# Patient Record
Sex: Female | Born: 1995 | Race: White | Hispanic: No | Marital: Single | Smoking: Never smoker
Health system: Southern US, Community
[De-identification: ages and names within clinical notes are randomized; demographics above are authoritative.]

## PROBLEM LIST (undated history)

## (undated) HISTORY — PX: BACK SURGERY: SHX140

---

## 2016-04-11 ENCOUNTER — Emergency Department (HOSPITAL_COMMUNITY): Payer: BLUE CROSS/BLUE SHIELD

## 2016-04-11 ENCOUNTER — Emergency Department (HOSPITAL_COMMUNITY)
Admission: EM | Admit: 2016-04-11 | Discharge: 2016-04-11 | Disposition: A | Discharge status: Home / Self Care | Payer: BLUE CROSS/BLUE SHIELD | Attending: Emergency Medicine | Admitting: Emergency Medicine

## 2016-04-11 ENCOUNTER — Encounter (HOSPITAL_COMMUNITY): Payer: Self-pay

## 2016-04-11 DIAGNOSIS — M545 Low back pain: Secondary | ICD-10-CM | POA: Diagnosis present

## 2016-04-11 DIAGNOSIS — Z791 Long term (current) use of non-steroidal anti-inflammatories (NSAID): Secondary | ICD-10-CM | POA: Diagnosis not present

## 2016-04-11 DIAGNOSIS — M546 Pain in thoracic spine: Secondary | ICD-10-CM | POA: Diagnosis not present

## 2016-04-11 MED ORDER — IBUPROFEN 800 MG PO TABS
800.0000 mg | ORAL_TABLET | Freq: Once | ORAL | Status: DC
  Filled 2016-04-11: qty 1

## 2016-04-11 MED ORDER — ACETAMINOPHEN 500 MG PO TABS
1000.0000 mg | ORAL_TABLET | Freq: Once | ORAL | Status: DC
  Filled 2016-04-11: qty 2

## 2016-04-11 MED ORDER — DIAZEPAM 5 MG PO TABS
5.0000 mg | ORAL_TABLET | Freq: Once | ORAL | Status: AC
  Administered 2016-04-11: 5 mg via ORAL
  Filled 2016-04-11: qty 1

## 2016-04-11 MED ORDER — OXYCODONE HCL 5 MG PO TABS
5.0000 mg | ORAL_TABLET | Freq: Once | ORAL | Status: AC
  Administered 2016-04-11: 5 mg via ORAL
  Filled 2016-04-11: qty 1

## 2016-04-11 NOTE — Discharge Instructions (Signed)
Take 4 over the counter ibuprofen tablets 3 times a day or 2 over-the-counter naproxen tablets twice a day for pain. ° °Back Pain, Adult °Back pain is very common in adults. The cause of back pain is rarely dangerous and the pain often gets better over time. The cause of your back pain may not be known. Some common causes of back pain include: °· Strain of the muscles or ligaments supporting the spine. °· Wear and tear (degeneration) of the spinal disks. °· Arthritis. °· Direct injury to the back. °For many people, back pain may return. Since back pain is rarely dangerous, most people can learn to manage this condition on their own. °HOME CARE INSTRUCTIONS °Watch your back pain for any changes. The following actions may help to lessen any discomfort you are feeling: °· Remain active. It is stressful on your back to sit or stand in one place for long periods of time. Do not sit, drive, or stand in one place for more than 30 minutes at a time. Take short walks on even surfaces as soon as you are able. Try to increase the length of time you walk each day. °· Exercise regularly as directed by your health care provider. Exercise helps your back heal faster. It also helps avoid future injury by keeping your muscles strong and flexible. °· Do not stay in bed. Resting more than 1-2 days can delay your recovery. °· Pay attention to your body when you bend and lift. The most comfortable positions are those that put less stress on your recovering back. Always use proper lifting techniques, including: °¨ Bending your knees. °¨ Keeping the load close to your body. °¨ Avoiding twisting. °· Find a comfortable position to sleep. Use a firm mattress and lie on your side with your knees slightly bent. If you lie on your back, put a pillow under your knees. °· Avoid feeling anxious or stressed. Stress increases muscle tension and can worsen back pain. It is important to recognize when you are anxious or stressed and learn ways to  manage it, such as with exercise. °· Take medicines only as directed by your health care provider. Over-the-counter medicines to reduce pain and inflammation are often the most helpful. Your health care provider may prescribe muscle relaxant drugs. These medicines help dull your pain so you can more quickly return to your normal activities and healthy exercise. °· Apply ice to the injured area: °¨ Put ice in a plastic bag. °¨ Place a towel between your skin and the bag. °¨ Leave the ice on for 20 minutes, 2-3 times a day for the first 2-3 days. After that, ice and heat may be alternated to reduce pain and spasms. °· Maintain a healthy weight. Excess weight puts extra stress on your back and makes it difficult to maintain good posture. °SEEK MEDICAL CARE IF: °· You have pain that is not relieved with rest or medicine. °· You have increasing pain going down into the legs or buttocks. °· You have pain that does not improve in one week. °· You have night pain. °· You lose weight. °· You have a fever or chills. °SEEK IMMEDIATE MEDICAL CARE IF:  °· You develop new bowel or bladder control problems. °· You have unusual weakness or numbness in your arms or legs. °· You develop nausea or vomiting. °· You develop abdominal pain. °· You feel faint. °  °This information is not intended to replace advice given to you by your health care provider. Make sure you discuss any questions you have   with your health care provider. °  °Document Released: 09/28/2005 Document Revised: 10/19/2014 Document Reviewed: 01/30/2014 °Elsevier Interactive Patient Education ©2016 [employer] ° °

## 2016-04-11 NOTE — ED Provider Notes (Signed)
CSN: [id number]     Arrival date & time 04/11/16  0546 History   First MD Initiated Contact with Patient 04/11/16 [phone]     Chief Complaint  Patient presents with  . Back Pain     (Consider location/radiation/quality/duration/timing/severity/associated sxs/prior Treatment) Patient is a 20 y.o. female presenting with back pain. The history is provided by the patient and a relative.  Back Pain Location:  Thoracic spine Quality:  Aching Radiates to: R buttock. Pain severity:  Moderate Onset quality:  Gradual Duration:  2 weeks Timing:  Constant Progression:  Worsening Chronicity:  New Relieved by:  Nothing Worsened by:  Movement and palpation Ineffective treatments:  None tried Associated symptoms: no chest pain, no dysuria, no fever and no headaches    20 yo F With a chief complaint of right lower back pain. This been going on for about a week or so. They said she's been riding in the car more often in thickness may be the cause. She's tried stretching with worsening of her symptoms. Pain is worse with movement palpation twisting. She denies colicky pain. Denies dysuria or increased frequency or hesitancy. She's been taking 800 mg ibuprofen with improvement. She denies any trauma. She does have a history of scoliosis with surgical repair. This was about 7 years ago.  History reviewed. No pertinent past medical history. Past Surgical History  Procedure Laterality Date  . Back surgery     No family history on file. Social History  Substance Use Topics  . Smoking status: Never Smoker   . Smokeless tobacco: None  . Alcohol Use: No   OB History    No data available     Review of Systems  Constitutional: Negative for fever and chills.  HENT: Negative for congestion and rhinorrhea.   Eyes: Negative for redness and visual disturbance.  Respiratory: Negative for shortness of breath and wheezing.   Cardiovascular: Negative for chest pain and palpitations.  Gastrointestinal:  Negative for nausea and vomiting.  Genitourinary: Negative for dysuria and urgency.  Musculoskeletal: Positive for back pain and arthralgias. Negative for myalgias.  Skin: Negative for pallor and wound.  Neurological: Negative for dizziness and headaches.      Allergies  Amoxicillin and Penicillin g  Home Medications   Prior to Admission medications   Medication Sig Start Date End Date Taking? Authorizing Provider  fluticasone (FLONASE) 50 MCG/ACT nasal spray Place 1 spray into both nostrils daily as needed for allergies or rhinitis.   Yes Historical Provider, MD  ibuprofen (ADVIL,MOTRIN) 200 MG tablet Take 800 mg by mouth every 6 (six) hours as needed for headache, mild pain or moderate pain.   Yes Historical Provider, MD   BP 97/64 mmHg  Pulse 66  Temp(Src) 97.4 F (36.3 C) (Oral)  Resp 12  Ht 5' 1.5" (1.562 m)  Wt 105 lb (47.628 kg)  BMI 19.52 kg/m2  SpO2 99%  LMP 04/11/2016 (Exact Date) Physical Exam  Constitutional: She is oriented to person, place, and time. She appears well-developed and well-nourished. No distress.  HENT:  Head: Normocephalic and atraumatic.  Eyes: EOM are normal. Pupils are equal, round, and reactive to light.  Neck: Normal range of motion. Neck supple.  Cardiovascular: Normal rate and regular rhythm.  Exam reveals no gallop and no friction rub.   No murmur heard. Pulmonary/Chest: Effort normal. She has no wheezes. She has no rales.  Abdominal: Soft. She exhibits no distension. There is no tenderness.  Musculoskeletal: She exhibits tenderness. She exhibits no edema.  Mild tenderness about the right CVA and down. No midline spinal tenderness. Negative straight leg raise test bilaterally. Pulse motor and sensation intact distally.  Neurological: She is alert and oriented to person, place, and time.  Skin: Skin is warm and dry. She is not diaphoretic.  Psychiatric: She has a normal mood and affect. Her behavior is normal.  Nursing note and vitals  reviewed.   ED Course  Procedures (including critical care time) Labs Review Labs Reviewed - No data to display  Imaging Review Dg Thoracic Spine 2 View  04/11/2016  CLINICAL DATA:  Back pain for 1 week, no known injury, initial encounter EXAM: THORACIC SPINE 2 VIEWS COMPARISON:  None. FINDINGS: Bilateral Harrington rods are seen. No rod fracture or screw fracture is identified. These extend from T2-L2 mild S-shaped scoliosis remains. No acute compression deformity is seen. No paraspinal mass lesion is noted. IMPRESSION: Mild scoliosis with evidence of fixation surgery. No acute abnormality is noted. Electronically Signed   By: Alcide CleverMark  Lukens M.D.   On: 04/11/2016 08:00   I have personally reviewed and evaluated these images and lab results as part of my medical decision-making.   EKG Interpretation None      MDM   Final diagnoses:  Right-sided thoracic back pain    20 yo F with a chief complaint of right-sided back pain. musculoskeletal by history. Family requesting a plain film.  Xray negative, d/c home.  2:52 PM:  I have discussed the diagnosis/risks/treatment options with the patient and family and believe the pt to be eligible for discharge home to follow-up with PCP. We also discussed returning to the ED immediately if new or worsening sx occur. We discussed the sx which are most concerning (e.g., sudden worsening pain, fever, inability to tolerate by mouth) that necessitate immediate return. Medications administered to the patient during their visit and any new prescriptions provided to the patient are listed below.  Medications given during this visit Medications  oxyCODONE (Oxy IR/ROXICODONE) immediate release tablet 5 mg (5 mg Oral Given 04/11/16 0811)  diazepam (VALIUM) tablet 5 mg (5 mg Oral Given 04/11/16 0811)    Discharge Medication List as of 04/11/2016  8:22 AM      The patient appears reasonably screen and/or stabilized for discharge and I doubt any other medical  condition or other [facility] requiring further screening, evaluation, or treatment in the ED at this time prior to discharge.    Melene Planan Hanifah Royse, DO 04/11/16 1452

## 2016-04-11 NOTE — ED Notes (Signed)
Waiting on CD copy of XRAY for pt's f/u appointment

## 2016-04-11 NOTE — ED Notes (Signed)
Cindy Prince c/o back pain that starts at mid back and radiates towards her sacrum.  Cindy Prince states that had Sx to correct scoliosis when she was 13 and has two rods down the spinal cord.  Cindy Prince states that has 19 screws and bolts.  Cindy Prince states that has been taking 800 mg. Ibuprofen for pain, states last had at 0400 this morning.  Cindy Prince denies numbness or tingling in extremities.  Cindy Prince states pain 10/10.

## 2017-10-26 IMAGING — CR DG THORACIC SPINE 2V
3 series · 3 of 3 positions shown · non-contrast
Comparison: None.

CLINICAL DATA: Back pain for 1 week, no known injury, initial
encounter

EXAM:
THORACIC SPINE 2 VIEWS

[t thoracic spine ap]
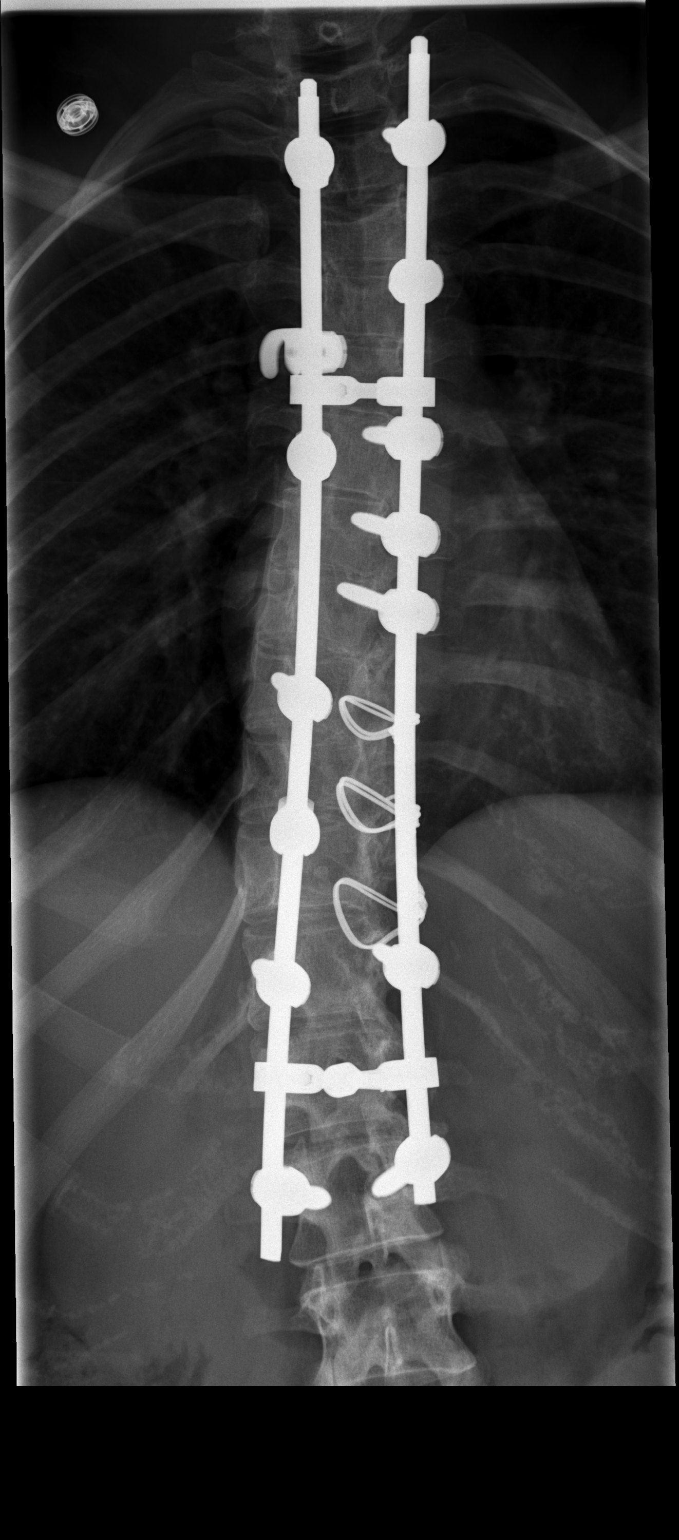

[t thoracic spine lat]
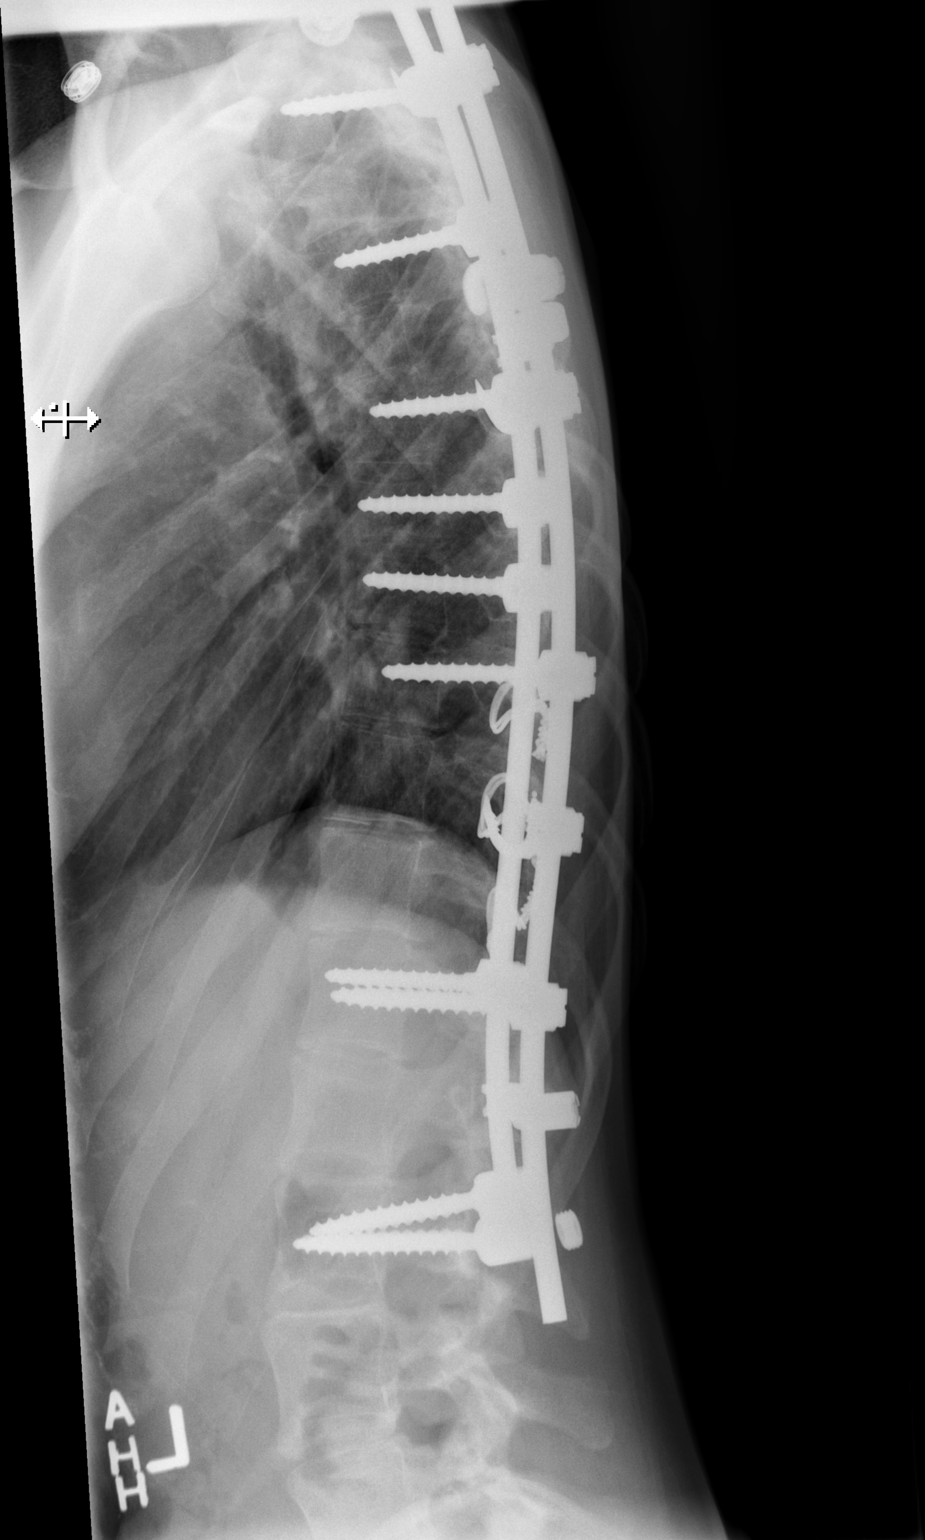

[t thoracic swimmers]
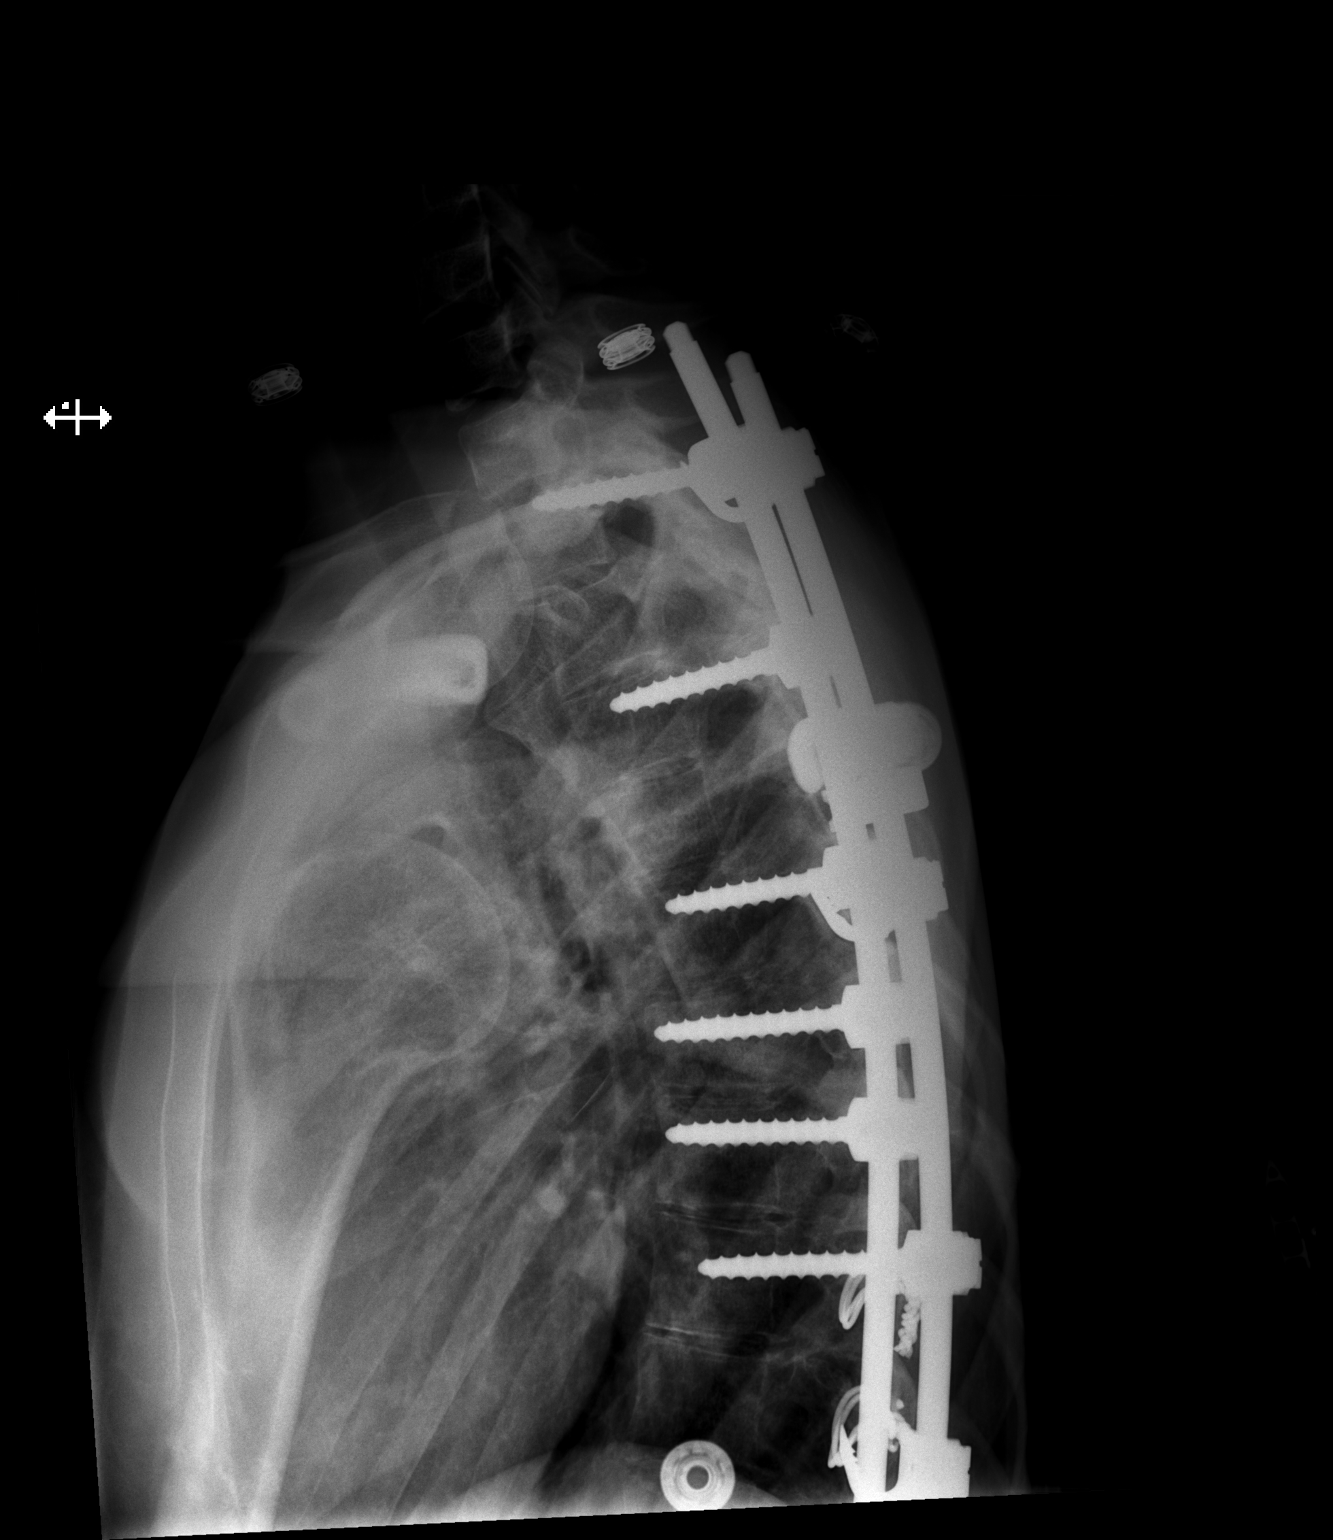

[3 of 3 positions shown; findings below may reference images not displayed]

FINDINGS: Bilateral Harrington rods are seen. No rod fracture or screw
fracture is identified. These extend from T2-L2 mild S-shaped
scoliosis remains. No acute compression deformity is seen. No
paraspinal mass lesion is noted.
IMPRESSION: Mild scoliosis with evidence of fixation surgery. No acute
abnormality is noted.

## 2024-09-29 ENCOUNTER — Ambulatory Visit (HOSPITAL_BASED_OUTPATIENT_CLINIC_OR_DEPARTMENT_OTHER)
Admission: EM | Admit: 2024-09-29 | Discharge: 2024-09-29 | Disposition: A | Discharge status: Home / Self Care | Attending: Family Medicine | Admitting: Family Medicine

## 2024-09-29 ENCOUNTER — Encounter (HOSPITAL_BASED_OUTPATIENT_CLINIC_OR_DEPARTMENT_OTHER): Payer: Self-pay

## 2024-09-29 DIAGNOSIS — R6889 Other general symptoms and signs: Secondary | ICD-10-CM

## 2024-09-29 LAB — POC COVID19/FLU A&B COMBO
Covid Antigen, POC: NEGATIVE
Influenza A Antigen, POC: NEGATIVE
Influenza B Antigen, POC: NEGATIVE

## 2024-09-29 NOTE — ED Provider Notes (Signed)
 " [facility]    CSN: [id number] Arrival date & time: 09/29/24  1016      History   Chief Complaint Chief Complaint  Patient presents with   Cough   Sore Throat   sinus drainage    HPI Cindy Prince is a 28 y.o. female.   Patient is a 28 year old female who presents today with sinus drainage, sore throat, cough, fever body aches, chills x 4 days. Fever 102 this morning.  She has been taking DayQuil, NyQuil and Tylenol .    Cough Sore Throat    History reviewed. No pertinent past medical history.  There are no active problems to display for this patient.   History reviewed. No pertinent surgical history.  OB History   No obstetric history on file.      Home Medications    Prior to Admission medications  Not on File    Family History History reviewed. No pertinent family history.  Social History Social History[1]   Allergies   Amoxicillin and Penicillins   Review of Systems Review of Systems  Respiratory:  Positive for cough.      Physical Exam Triage Vital Signs ED Triage Vitals  Encounter Vitals Group     BP 09/29/24 1042 120/83     Girls Systolic BP Percentile --      Girls Diastolic BP Percentile --      Boys Systolic BP Percentile --      Boys Diastolic BP Percentile --      Pulse Rate 09/29/24 1042 (!) 117     Resp 09/29/24 1042 20     Temp 09/29/24 1042 100 F (37.8 C)     Temp Source 09/29/24 1042 Oral     SpO2 09/29/24 1042 96 %     Weight --      Height --      Head Circumference --      Peak Flow --      Pain Score 09/29/24 1044 3     Pain Loc --      Pain Education --      Exclude from Growth Chart --    No data found.  Updated Vital Signs BP 120/83 (BP Location: Right Arm)   Pulse (!) 117   Temp 100 F (37.8 C) (Oral)   Resp 20   LMP 09/26/2024   SpO2 96%   Visual Acuity Right Eye Distance:   Left Eye Distance:   Bilateral Distance:    Right Eye Near:   Left Eye Near:    Bilateral Near:      Physical Exam Constitutional:      General: She is not in acute distress.    Appearance: Normal appearance. She is not ill-appearing, toxic-appearing or diaphoretic.  HENT:     Head: Normocephalic and atraumatic.     Right Ear: Tympanic membrane and ear canal normal.     Left Ear: Tympanic membrane and ear canal normal.     Nose: Congestion present.     Mouth/Throat:     Pharynx: Oropharynx is clear.  Eyes:     Conjunctiva/sclera: Conjunctivae normal.  Cardiovascular:     Rate and Rhythm: Normal rate and regular rhythm.     Pulses: Normal pulses.     Heart sounds: Normal heart sounds.  Pulmonary:     Effort: Pulmonary effort is normal.     Breath sounds: Normal breath sounds.  Skin:    General: Skin is warm and dry.  Neurological:  Mental Status: She is alert.  Psychiatric:        Mood and Affect: Mood normal.      UC Treatments / Results  Labs (all labs ordered are listed, but only abnormal results are displayed) Labs Reviewed  POC COVID19/FLU A&B COMBO - Normal    EKG   Radiology No results found.  Procedures Procedures (including critical care time)  Medications Ordered in UC Medications - No data to display  Initial Impression / Assessment and Plan / UC Course  I have reviewed the triage vital signs and the nursing notes.  Pertinent labs & imaging results that were available during my care of the patient were reviewed by me and considered in my medical decision making (see chart for details).     Viral illness-no concerns on exam.  Recommend symptomatic treatment for relief of symptoms. Rest, hydrate and follow-up as needed  Final Clinical Impressions(s) / UC Diagnoses   Final diagnoses:  Flu-like symptoms     Discharge Instructions      Viral illness-no concerns on exam.  Recommend symptomatic treatment for relief of symptoms. Rest, hydrate and follow-up as needed      ED Prescriptions   None    PDMP not reviewed this  encounter.     [1]  Social History Tobacco Use   Smoking status: Never   Smokeless tobacco: Never  Vaping Use   Vaping status: Never Used     Adah Wilbert LABOR, FNP 09/29/24 1150  "

## 2024-09-29 NOTE — ED Triage Notes (Signed)
 Sinus drainage, sore throat, cough, fever x 3 days. Fever 102 this morning.

## 2024-09-29 NOTE — Discharge Instructions (Signed)
 Viral illness-no concerns on exam.  Recommend symptomatic treatment for relief of symptoms. Rest, hydrate and follow-up as needed
